# Patient Record
Sex: Male | Born: 1985 | Race: Black or African American | Hispanic: No | Marital: Single | Smoking: Current every day smoker
Health system: Southern US, Community
[De-identification: ages and names within clinical notes are randomized; demographics above are authoritative.]

## PROBLEM LIST (undated history)

## (undated) DIAGNOSIS — I10 Essential (primary) hypertension: Secondary | ICD-10-CM

---
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
## 2016-07-14 ENCOUNTER — Emergency Department (HOSPITAL_COMMUNITY)
Admission: EM | Admit: 2016-07-14 | Discharge: 2016-07-14 | Disposition: A | Discharge status: Home / Self Care | Payer: Self-pay | Attending: Emergency Medicine | Admitting: Emergency Medicine

## 2016-07-14 ENCOUNTER — Encounter (HOSPITAL_COMMUNITY): Payer: Self-pay | Admitting: Emergency Medicine

## 2016-07-14 ENCOUNTER — Emergency Department (HOSPITAL_COMMUNITY): Payer: Self-pay

## 2016-07-14 DIAGNOSIS — S93402A Sprain of unspecified ligament of left ankle, initial encounter: Secondary | ICD-10-CM | POA: Insufficient documentation

## 2016-07-14 DIAGNOSIS — S82832A Other fracture of upper and lower end of left fibula, initial encounter for closed fracture: Secondary | ICD-10-CM | POA: Insufficient documentation

## 2016-07-14 DIAGNOSIS — Y999 Unspecified external cause status: Secondary | ICD-10-CM | POA: Insufficient documentation

## 2016-07-14 DIAGNOSIS — Y929 Unspecified place or not applicable: Secondary | ICD-10-CM | POA: Insufficient documentation

## 2016-07-14 DIAGNOSIS — W1839XA Other fall on same level, initial encounter: Secondary | ICD-10-CM | POA: Insufficient documentation

## 2016-07-14 DIAGNOSIS — Y939 Activity, unspecified: Secondary | ICD-10-CM | POA: Insufficient documentation

## 2016-07-14 MED ORDER — OXYCODONE-ACETAMINOPHEN 5-325 MG PO TABS: 1.0000 | ORAL_TABLET | Freq: Four times a day (QID) | ORAL | 0 refills | Status: AC | PRN

## 2016-07-14 MED ORDER — IBUPROFEN 800 MG PO TABS
800.0000 mg | ORAL_TABLET | Freq: Once | ORAL | Status: AC
  Administered 2016-07-14: 800 mg via ORAL
  Filled 2016-07-14: qty 1

## 2016-07-14 MED ORDER — OXYCODONE-ACETAMINOPHEN 5-325 MG PO TABS
1.0000 | ORAL_TABLET | Freq: Once | ORAL | Status: AC
  Administered 2016-07-14: 1 via ORAL
  Filled 2016-07-14: qty 1

## 2016-07-14 NOTE — Discharge Instructions (Signed)
Please take 1-2 tablets of Percocet as needed for pain. Please schedule an appointment with orthopedics tomorrow for follow-up. Keep lower left leg immobilized, apply ice, elevated, use knee immobilizer and crutches given to him in ED.   SEEK IMMEDIATE MEDICAL CARE IF:  Your cast gets damaged or breaks. You have continued severe pain or more swelling than you did before the cast was put on, or the pain is not controlled with medications. Your skin or nails below the injury turn blue or grey, or feel cold or numb. There is a bad smell or pus coming from under the cast. You develop severe pain in ankle or foot.  Get help right away if: Your toes or foot becomes numb or blue. You have severe pain that gets worse.

## 2016-07-14 NOTE — ED Notes (Signed)
Patient given ice for injury.

## 2016-07-14 NOTE — ED Provider Notes (Signed)
[facility]-EMERGENCY DEPT Provider Note   CSN: [id number] Arrival date & time: 07/14/16  1108     History   Chief Complaint Chief Complaint  Patient presents with  . Ankle Pain    HPI Cory Obrien is a 30 y.o. male with no significant past medical history presents with left ankle pain after mechanical fall this morning at 3 AM. He states that he fell on black ice yesterday while intoxicated and did not realize extent of his injury until he woke up this morning. He states it's a throbbing, sharp, 10/10 pain. He reports left ankle swelling. Patient denies LOC, head injury, chest pain, shortness of breath, fevers, chills, nausea, vomiting, changes in bowel habits, changes in urinary symptoms.  HPI  History reviewed. No pertinent past medical history.  There are no active problems to display for this patient.   History reviewed. No pertinent surgical history.     Home Medications    Prior to Admission medications   Medication Sig Start Date End Date Taking? Authorizing Provider  oxyCODONE-acetaminophen (PERCOCET/ROXICET) 5-325 MG tablet Take 1-2 tablets by mouth every 6 (six) hours as needed for severe pain. 07/14/16   Christon Gallaway Orson AloeManuel Nikole Swartzentruber, [place]    Family History History reviewed. No pertinent family history.  Social History Social History  Substance Use Topics  . Smoking status: Never Smoker  . Smokeless tobacco: Not on file  . Alcohol use Yes     Allergies   Patient has no known allergies.   Review of Systems Review of Systems  Constitutional: Negative for chills and fever.  Respiratory: Negative for cough, chest tightness and shortness of breath.   Cardiovascular: Negative for chest pain.  Gastrointestinal: Negative for abdominal pain, constipation, diarrhea, nausea and vomiting.  Musculoskeletal: Positive for arthralgias (Left ankle) and joint swelling (Left ankle).  Skin: Negative for wound.  Neurological: Negative for headaches.     Physical  Exam Updated Vital Signs BP 148/96 (BP Location: Right Arm)   Pulse 98   Temp 98.8 F (37.1 C) (Oral)   Resp 16   SpO2 100%   Physical Exam  Constitutional: He is oriented to person, place, and time. He appears well-developed and well-nourished.  HENT:  Head: Normocephalic and atraumatic.  Eyes: EOM are normal. Pupils are equal, round, and reactive to light.  Cardiovascular: Normal rate and normal heart sounds.   Pulmonary/Chest: Effort normal. No respiratory distress.  Musculoskeletal: He exhibits edema (Left ankle) and tenderness (Left ankle TTP, limited ROM, no erythema. Left knee TTP, no erythema.). He exhibits no deformity.  Neurological: He is alert and oriented to person, place, and time. No sensory deficit.  Skin: Skin is warm.  Psychiatric: He has a normal mood and affect.  Nursing note and vitals reviewed.    ED Treatments / Results  Labs (all labs ordered are listed, but only abnormal results are displayed) Labs Reviewed - No data to display  EKG  EKG Interpretation None       Radiology Dg Ankle Complete Left  Result Date: 07/14/2016 CLINICAL DATA:  Or EXAM: LEFT ANKLE COMPLETE - 3+ VIEW COMPARISON:  None. FINDINGS: Medial soft-tissue swelling with no underlying fracture or dislocation identified. IMPRESSION: Medial soft tissue swelling.  No fracture identified. Electronically Signed   By: Gerome Samavid  Williams III M.D   On: 07/14/2016 11:58   Dg Knee Complete 4 Views Left  Result Date: 07/14/2016 CLINICAL DATA:  Left knee pain. EXAM: LEFT KNEE - COMPLETE 4+ VIEW COMPARISON:  None. FINDINGS: No evidence  of fracture, dislocation, or joint effusion of the left knee. There is a spiral fracture of the proximal fibula, without significant angulation, and mild lateral displacement of the distal fracture fragment. No evidence of arthropathy or other focal bone abnormality. Soft tissue swelling is noted. IMPRESSION: Spiral fracture of the proximal fibula. No evidence of  fracture or dislocation of the knee. Electronically Signed   By: Ted Mcalpineobrinka  Dimitrova M.D.   On: 07/14/2016 14:22    Procedures Procedures (including critical care time)  Medications Ordered in ED Medications  ibuprofen (ADVIL,MOTRIN) tablet 800 mg (800 mg Oral Given 07/14/16 1353)  oxyCODONE-acetaminophen (PERCOCET/ROXICET) 5-325 MG per tablet 1 tablet (1 tablet Oral Given 07/14/16 1521)     Initial Impression / Assessment and Plan / ED Course  I have reviewed the triage vital signs and the nursing notes.  Pertinent labs & imaging results that were available during my care of the patient were reviewed by me and considered in my medical decision making (see chart for details).  Clinical Course   Patient TTP to left ankle and at left knee. Ankle Xray negative for fracture or dislocation, suspect sprain. Left Knee xray shows evidence of spiral fracture at the proximal fibula. Knee immobilizer and Ace bandage applied. Crutches given. WBAT.  Discussed to schedule an appointment tomorrow for follow-up with orthopedic. Return precautions given for any new or worsening symptoms.    Final Clinical Impressions(s) / ED Diagnoses   Final diagnoses:  Closed fracture of proximal end of left fibula, unspecified fracture morphology, initial encounter  Sprain of left ankle, unspecified ligament, initial encounter    New Prescriptions Discharge Medication List as of 07/14/2016  3:25 PM    START taking these medications   Details  oxyCODONE-acetaminophen (PERCOCET/ROXICET) 5-325 MG tablet Take 1-2 tablets by mouth every 6 (six) hours as needed for severe pain., Starting Sun 07/14/2016, Print         [address]Hjalmer Iovino Manuel [place], [place] 07/14/16 2030    Lyndal Pulleyaniel Knott, MD 07/15/16 [phone]

## 2016-07-14 NOTE — ED Triage Notes (Signed)
Pt reports he was intoxicated last night and fell on ice. Only injury L ankle pain. No LOC or head injury. Has tried to bear weight at home

## 2017-06-05 ENCOUNTER — Encounter (HOSPITAL_COMMUNITY): Payer: Self-pay | Admitting: Emergency Medicine

## 2017-06-05 ENCOUNTER — Emergency Department (HOSPITAL_COMMUNITY)
Admission: EM | Admit: 2017-06-05 | Discharge: 2017-06-05 | Disposition: A | Discharge status: Home / Self Care | Payer: Self-pay | Attending: Emergency Medicine | Admitting: Emergency Medicine

## 2017-06-05 DIAGNOSIS — F17210 Nicotine dependence, cigarettes, uncomplicated: Secondary | ICD-10-CM | POA: Insufficient documentation

## 2017-06-05 DIAGNOSIS — Z79899 Other long term (current) drug therapy: Secondary | ICD-10-CM | POA: Insufficient documentation

## 2017-06-05 DIAGNOSIS — Z202 Contact with and (suspected) exposure to infections with a predominantly sexual mode of transmission: Secondary | ICD-10-CM | POA: Insufficient documentation

## 2017-06-05 DIAGNOSIS — I10 Essential (primary) hypertension: Secondary | ICD-10-CM | POA: Insufficient documentation

## 2017-06-05 HISTORY — DX: Essential (primary) hypertension: I10

## 2017-06-05 NOTE — ED Triage Notes (Signed)
Pt reports exposure to STD, partner Dx with STD yesterday, no symptoms at this time, requesting STD check

## 2017-06-05 NOTE — ED Notes (Signed)
Patient refuses HIV/RPR blood tests today.

## 2017-06-05 NOTE — ED Provider Notes (Signed)
[facility]-EMERGENCY DEPT Provider Note   CSN: [id number] Arrival date & time: 06/05/17  0825     History   Chief Complaint Chief Complaint  Patient presents with  . Exposure to STD    HPI Cory Obrien is a 31 y.o. male.  HPI   Patient is a 32 year old male with a history of hypertension presenting for concern that he may have an STI.  Patient reports that his partner currently tested positive for gonorrhea and was treated.  Patient is unsure if he has any discharge at this time.  Patient denies any penile pain, dysuria, rashes or lesions of the groin, testicular pain, rectal pain, abdominal pain.  Patient has not had any fever or chills.  Patient does not have a history of STI.  Past Medical History:  Diagnosis Date  . Hypertension     There are no active problems to display for this patient.   History reviewed. No pertinent surgical history.     Home Medications    Prior to Admission medications   Medication Sig Start Date End Date Taking? Authorizing Provider  oxyCODONE-acetaminophen (PERCOCET/ROXICET) 5-325 MG tablet Take 1-2 tablets by mouth every 6 (six) hours as needed for severe pain. 07/14/16   Alvina Chou, PA    Family History History reviewed. No pertinent family history.  Social History Social History  Substance Use Topics  . Smoking status: Current Every Day Smoker  . Smokeless tobacco: Not on file  . Alcohol use Yes     Allergies   Patient has no known allergies.   Review of Systems Review of Systems  Constitutional: Negative for chills and fever.  Gastrointestinal: Negative for abdominal pain and rectal pain.  Genitourinary: Negative for discharge, dysuria and flank pain.  Skin: Negative for rash.     Physical Exam Updated Vital Signs BP (!) 163/105 (BP Location: Right Arm)   Pulse 60   Temp 98.4 F (36.9 C) (Oral)   Resp 18   Ht 6\' 4"  (1.93 m)   Wt 99.8 kg (220 lb)   SpO2 100%   BMI  26.78 kg/m   Physical Exam  Constitutional: He appears well-developed and well-nourished. No distress.  Sitting comfortably in bed.  HENT:  Head: Normocephalic and atraumatic.  Eyes: Conjunctivae are normal. Right eye exhibits no discharge. Left eye exhibits no discharge.  EOMs normal to gross examination.  Neck: Normal range of motion.  Cardiovascular: Normal rate and regular rhythm.   Intact, 2+ radial pulse.  Pulmonary/Chest:  Normal respiratory effort. Patient converses comfortably. No audible wheeze or stridor.  Abdominal: He exhibits no distension.  Genitourinary:  Genitourinary Comments: Exam performed with nurse chaperone present.  Circumcised male. No rashes or lesions of the groin.  No inguinal lymphadenopathy palpated.  No pain to palpation of testes or epididymis.  No penile pain.  No active penile discharge.  Musculoskeletal: Normal range of motion.  Neurological: He is alert.  Cranial nerves intact to gross observation. Patient moves extremities without difficulty.  Skin: Skin is warm and dry. He is not diaphoretic.  Psychiatric: He has a normal mood and affect. His behavior is normal. Judgment and thought content normal.  Nursing note and vitals reviewed.    ED Treatments / Results  Labs (all labs ordered are listed, but only abnormal results are displayed) Labs Reviewed  RPR  HIV ANTIBODY (ROUTINE TESTING)  GC/CHLAMYDIA PROBE AMP ([place]) NOT AT [facility]    EKG  EKG Interpretation None  Radiology No results found.  Procedures Procedures (including critical care time)  Medications Ordered in ED Medications - No data to display   Initial Impression / Assessment and Plan / ED Course  I have reviewed the triage vital signs and the nursing notes.  Pertinent labs & imaging results that were available during my care of the patient were reviewed by me and considered in my medical decision making (see chart for details).     Final Clinical  Impressions(s) / ED Diagnoses   Final diagnoses:  STD exposure   Patient is a 31 y.o. male who presents to ED for STI exposure and concern that he has symptoms. He is afebrile without abdominal tenderness, abdominal pain or painful bowel movements to indicate prostatitis.  No tenderness to palpation of the testes or epididymis to suggest orchitis or epididymitis.  STD cultures obtained gonorrhea and chlamydia.  Patient refused HIV and syphilis testing today.  Patient to be discharged with [facility]. Discussed importance of using protection when sexually active. Patient understands that they have GC/Chlamydia cultures pending and that they will need to inform all sexual partners if results return positive.  No active discharge, dysuria, testicular pain, abdominal pain, rectal pain.  I discussed with patient is unlikely he has an STI at this time, and we will defer treatment until results are back, and patient can follow-up on line.  Patient instructed that he will get a call if they are positive with instructions for treatment. Return precautions for any fever, chills, rectal pain, testicular pain, or abdominal pain given.  Precautions given and all questions answered.    New Prescriptions Discharge Medication List as of 06/05/2017 10:07 AM       Elisha PonderMurray, Alyssa B, PA-C 06/05/17 1124    Azalia Bilisampos, Kevin, MD 06/05/17 1540

## 2017-06-05 NOTE — Discharge Instructions (Signed)
Medications.  If her gonorrhea chlamydia test is positive, you will receive a call and instructions on how to get your antibiotics.  Use a condom with every sexual encounter [facility]. Please return to the ER for worsening symptoms, high fevers or persistent vomiting.  You have been tested for chlamydia and gonorrhea. These results will be available in approximately 3 days. You will be notified if they are positive.   It is very important to practice safe sex and use condoms when sexually active. If your results are positive you need to notify all sexual partners so they can be treated as well. The website https://garcia.net/http://www.dontspreadit.com/ can be used to send anonymous text messages or emails to alert sexual contacts.   SEEK IMMEDIATE MEDICAL CARE IF:  You develop an oral temperature above 102 F (38.9 C), not controlled by medications or lasting more than 2 days.  You develop an increase in pain.  You develop penile or testicular pain. You develop painful intercourse.

## 2017-06-06 LAB — GC/CHLAMYDIA PROBE AMP (~~LOC~~) NOT AT ARMC
Chlamydia: POSITIVE — AB
NEISSERIA GONORRHEA: POSITIVE — AB

## 2021-12-17 ENCOUNTER — Emergency Department (HOSPITAL_COMMUNITY)
Admission: EM | Admit: 2021-12-17 | Discharge: 2021-12-17 | Disposition: A | Discharge status: Home / Self Care | Payer: Self-pay | Attending: Emergency Medicine | Admitting: Emergency Medicine

## 2021-12-17 ENCOUNTER — Emergency Department (HOSPITAL_COMMUNITY): Payer: Self-pay

## 2021-12-17 DIAGNOSIS — T40604A Poisoning by unspecified narcotics, undetermined, initial encounter: Secondary | ICD-10-CM

## 2021-12-17 DIAGNOSIS — F1910 Other psychoactive substance abuse, uncomplicated: Secondary | ICD-10-CM

## 2021-12-17 DIAGNOSIS — R410 Disorientation, unspecified: Secondary | ICD-10-CM | POA: Insufficient documentation

## 2021-12-17 DIAGNOSIS — R61 Generalized hyperhidrosis: Secondary | ICD-10-CM | POA: Insufficient documentation

## 2021-12-17 DIAGNOSIS — T40601A Poisoning by unspecified narcotics, accidental (unintentional), initial encounter: Secondary | ICD-10-CM | POA: Insufficient documentation

## 2021-12-17 LAB — CBC WITH DIFFERENTIAL/PLATELET
Abs Immature Granulocytes: 0.2 10*3/uL — ABNORMAL HIGH (ref 0.00–0.07)
Basophils Absolute: 0.1 10*3/uL (ref 0.0–0.1)
Basophils Relative: 1 %
Eosinophils Absolute: 0.3 10*3/uL (ref 0.0–0.5)
Eosinophils Relative: 2 %
HCT: 45.8 % (ref 39.0–52.0)
Hemoglobin: 16 g/dL (ref 13.0–17.0)
Immature Granulocytes: 1 %
Lymphocytes Relative: 22 %
Lymphs Abs: 3.4 10*3/uL (ref 0.7–4.0)
MCH: 31.9 pg (ref 26.0–34.0)
MCHC: 34.9 g/dL (ref 30.0–36.0)
MCV: 91.4 fL (ref 80.0–100.0)
Monocytes Absolute: 0.9 10*3/uL (ref 0.1–1.0)
Monocytes Relative: 6 %
Neutro Abs: 10.3 10*3/uL — ABNORMAL HIGH (ref 1.7–7.7)
Neutrophils Relative %: 68 %
Platelets: 308 10*3/uL (ref 150–400)
RBC: 5.01 MIL/uL (ref 4.22–5.81)
RDW: 13.2 % (ref 11.5–15.5)
WBC: 15 10*3/uL — ABNORMAL HIGH (ref 4.0–10.5)
nRBC: 0 % (ref 0.0–0.2)

## 2021-12-17 LAB — COMPREHENSIVE METABOLIC PANEL
ALT: 30 U/L (ref 0–44)
AST: 22 U/L (ref 15–41)
Albumin: 4.8 g/dL (ref 3.5–5.0)
Alkaline Phosphatase: 93 U/L (ref 38–126)
Anion gap: 9 (ref 5–15)
BUN: 11 mg/dL (ref 6–20)
CO2: 25 mmol/L (ref 22–32)
Calcium: 9.2 mg/dL (ref 8.9–10.3)
Chloride: 107 mmol/L (ref 98–111)
Creatinine, Ser: 1.09 mg/dL (ref 0.61–1.24)
GFR, Estimated: >60 mL/min (ref >60)
Glucose, Bld: 223 mg/dL — ABNORMAL HIGH (ref 70–99)
Potassium: 3.3 mmol/L — ABNORMAL LOW (ref 3.5–5.1)
Sodium: 141 mmol/L (ref 135–145)
Total Bilirubin: 0.4 mg/dL (ref 0.3–1.2)
Total Protein: 8.4 g/dL — ABNORMAL HIGH (ref 6.5–8.1)

## 2021-12-17 LAB — RAPID URINE DRUG SCREEN, HOSP PERFORMED
Amphetamines: NOT DETECTED
Barbiturates: NOT DETECTED
Benzodiazepines: NOT DETECTED
Cocaine: POSITIVE — AB
Opiates: NOT DETECTED
Tetrahydrocannabinol: POSITIVE — AB

## 2021-12-17 LAB — LACTIC ACID, PLASMA: Lactic Acid, Venous: 1.1 mmol/L (ref 0.5–1.9)

## 2021-12-17 LAB — CBG MONITORING, ED
Glucose-Capillary: 210 mg/dL — ABNORMAL HIGH (ref 70–99)
Glucose-Capillary: 180 mg/dL — ABNORMAL HIGH (ref 70–99)

## 2021-12-17 LAB — ETHANOL: Alcohol, Ethyl (B): <10 mg/dL (ref <10)

## 2021-12-17 MED ORDER — NALOXONE HCL 2 MG/2ML IJ SOSY
PREFILLED_SYRINGE | INTRAMUSCULAR | Status: AC
  Filled 2021-12-17: qty 2

## 2021-12-17 MED ORDER — LACTATED RINGERS IV SOLN: INTRAVENOUS | Status: DC

## 2021-12-17 MED ORDER — LACTATED RINGERS IV BOLUS
1000.0000 mL | Freq: Once | INTRAVENOUS | Status: AC
  Administered 2021-12-17: 1000 mL via INTRAVENOUS

## 2021-12-17 MED ORDER — NALOXONE HCL 2 MG/2ML IJ SOSY
1.0000 mg | PREFILLED_SYRINGE | Freq: Once | INTRAMUSCULAR | Status: AC
  Administered 2021-12-17: 1 mg via INTRAVENOUS

## 2021-12-17 NOTE — ED Notes (Signed)
Verbal order from Dr Freida Busman to give 1mg  narcan.  ?

## 2021-12-17 NOTE — ED Triage Notes (Signed)
Ems brings pt in from home. Pt was found by roommate on the floor unresponsive, diaphoretic. Ems gave narcan IN x1, IV x1. Unknown substance use. ?

## 2021-12-17 NOTE — ED Provider Notes (Addendum)
?[facility]-EMERGENCY DEPT ?Provider Note ? ? ?CSN: [id number] ?Arrival date & time: 12/17/21  1456 ? ?  ? ?History ? ?Chief Complaint  ?Patient presents with  ? Drug Overdose  ? ? ?Cory Obrien is a 36 y.o. male. ? ?36 year old male presents via EMS after being found by his roommates with decreased level consciousness.  Patient reportedly uses illegal substances.  He also drinks alcohol.  Was found diaphoretic at home.  Given Narcan intranasal and IV.  Blood sugar was above 100.  Good response to this and patient transported here ? ? ?  ? ?Home Medications ?Prior to Admission medications   ?Medication Sig Start Date End Date Taking? Authorizing Provider  ?oxyCODONE-acetaminophen (PERCOCET/ROXICET) 5-325 MG tablet Take 1-2 tablets by mouth every 6 (six) hours as needed for severe pain. 07/14/16   Alvina Chou, PA  ?   ? ?Allergies    ?Patient has no known allergies.   ? ?Review of Systems   ?Review of Systems  ?Unable to perform ROS: Mental status change  ? ?Physical Exam ?Updated Vital Signs ?BP (!) 185/133   Pulse 69   Temp (!) 95.1 ?F (35.1 ?C) (Rectal)   Resp 11   SpO2 97%  ?Physical Exam ?Vitals and nursing note reviewed.  ?Constitutional:   ?   General: He is not in acute distress. ?   Appearance: Normal appearance. He is well-developed. He is ill-appearing, toxic-appearing and diaphoretic.  ?HENT:  ?   Head: Normocephalic and atraumatic.  ?Eyes:  ?   General: Lids are normal.  ?   Conjunctiva/sclera: Conjunctivae normal.  ?   Pupils: Pupils are equal, round, and reactive to light.  ?Neck:  ?   Thyroid: No thyroid mass.  ?   Trachea: No tracheal deviation.  ?Cardiovascular:  ?   Rate and Rhythm: Normal rate and regular rhythm.  ?   Heart sounds: Normal heart sounds. No murmur heard. ?  No gallop.  ?Pulmonary:  ?   Effort: Pulmonary effort is normal. No respiratory distress.  ?   Breath sounds: Normal breath sounds. No stridor. No decreased breath sounds, wheezing,  rhonchi or rales.  ?Abdominal:  ?   General: There is no distension.  ?   Palpations: Abdomen is soft.  ?   Tenderness: There is no abdominal tenderness. There is no rebound.  ?Musculoskeletal:     ?   General: No tenderness. Normal range of motion.  ?   Cervical back: Normal range of motion and neck supple.  ?Skin: ?   General: Skin is warm.  ?   Findings: No abrasion or rash.  ?   Comments: Diaphoresis noted  ?Neurological:  ?   Mental Status: Mental status is at baseline. He is lethargic and disoriented.  ?   GCS: GCS eye subscore is 4. GCS verbal subscore is 4. GCS motor subscore is 6.  ?   Cranial Nerves: No cranial nerve deficit.  ?   Sensory: No sensory deficit.  ?   Motor: No tremor or abnormal muscle tone.  ?Psychiatric:     ?   Attention and Perception: Attention normal.     ?   Speech: Speech normal.     ?   Behavior: Behavior normal.  ? ? ?ED Results / Procedures / Treatments   ?Labs ?(all labs ordered are listed, but only abnormal results are displayed) ?Labs Reviewed  ?CBG MONITORING, ED - Abnormal; Notable for the following components:  ?  Result Value  ? Glucose-Capillary 210 (*)   ? All other components within normal limits  ?CBC WITH DIFFERENTIAL/PLATELET  ?RAPID URINE DRUG SCREEN, HOSP PERFORMED  ?COMPREHENSIVE METABOLIC PANEL  ?ETHANOL  ? ? ?EKG ?EKG Interpretation ? ?Date/Time:  Monday Dec 17 2021 15:16:11 EDT ?Ventricular Rate:  72 ?PR Interval:  161 ?QRS Duration: 90 ?QT Interval:  434 ?QTC Calculation: 475 ?R Axis:   48 ?Text Interpretation: Sinus rhythm Borderline prolonged QT interval Confirmed by Lorre Nick ([id number]) on 12/17/2021 3:42:40 PM ? ?Radiology ?No results found. ? ?Procedures ?Procedures  ? ? ?Medications Ordered in ED ?Medications  ?lactated ringers bolus 1,000 mL (has no administration in time range)  ?lactated ringers infusion (has no administration in time range)  ? ? ?ED Course/ Medical Decision Making/ A&P ?  ?                        ?Medical Decision Making ?Amount  and/or Complexity of Data Reviewed ?Labs: ordered. ?Radiology: ordered. ? ?Risk ?Prescription drug management. ? ? ?Patient came here after having unknown overdose.  Patient would protect his airway.  Was given IV fluids here.  UDS positive for cocaine.  Considered possible intracranial abnormality including hemorrhage.  Did have a head CT which per my review interpretation was negative for any acute process.  CT cervical spine per my review interpretation showed no acute findings.  Electrolytes significant for mild hyperglycemia at 223.  Alcohol level negative.  Patient more somnolent and given Narcan 1 mg and improved his mental status greatly.  Suspect that patient did take an unknown opiate as he had a significant response to this.  Patient monitored here for several hours and multiple rechecks and is now awake and alert.  He denies any suicidal or homicidal ideations.  No acute psychosis noted.  States that he was only trying to become intoxicated.  Will be discharged.  Patient does admit to ingesting other substances at this time. ? ? ?CRITICAL CARE ?Performed by: Toy Baker ?Total critical care time: 50 minutes ?Critical care time was exclusive of separately billable procedures and treating other patients. ?Critical care was necessary to treat or prevent imminent or life-threatening deterioration. ?Critical care was time spent personally by me on the following activities: development of treatment plan with patient and/or surrogate as well as nursing, discussions with consultants, evaluation of patient's response to treatment, examination of patient, obtaining history from patient or surrogate, ordering and performing treatments and interventions, ordering and review of laboratory studies, ordering and review of radiographic studies, pulse oximetry and re-evaluation of patient's condition. ? ? ? ? ? ? ? ?Final Clinical Impression(s) / ED Diagnoses ?Final diagnoses:  ?None  ? ? ?Rx / DC Orders ?ED  Discharge Orders   ? ? None  ? ?  ? ? ?  ?Lorre Nick, MD ?12/17/21 2148 ? ?  ?Lorre Nick, MD ?12/17/21 2149 ? ?  ?Lorre Nick, MD ?12/17/21 2150 ? ?

## 2021-12-22 LAB — CULTURE, BLOOD (ROUTINE X 2)
Culture: NO GROWTH
Culture: NO GROWTH
Special Requests: ADEQUATE

## 2023-03-01 IMAGING — CT CT HEAD W/O CM
3 series · 14 of 47 positions shown, 16 images · non-contrast
Comparison: None Available.

CLINICAL DATA: Altered mental status



[Series 2: head wo · axial · 0.50mm/px · z∈[-92,+43]mm · 8 of 33 slices shown, 10 images]
[im 3/33  brain]
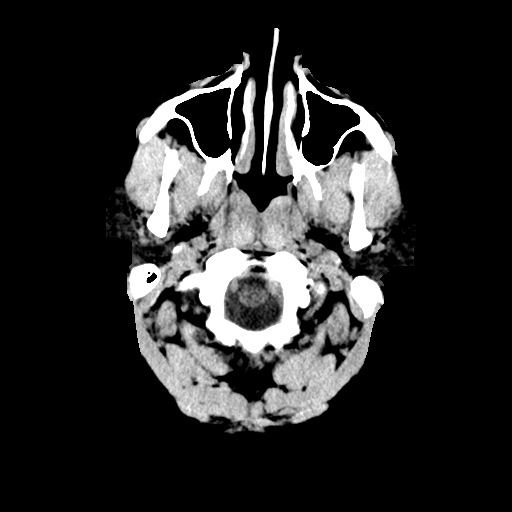
[im 3/33  bone]
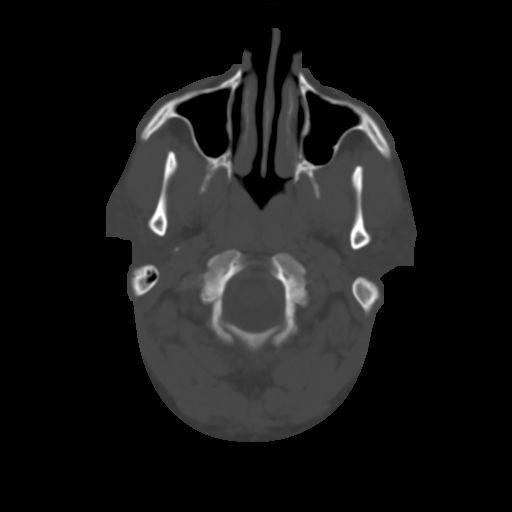
[im 7/33  brain]
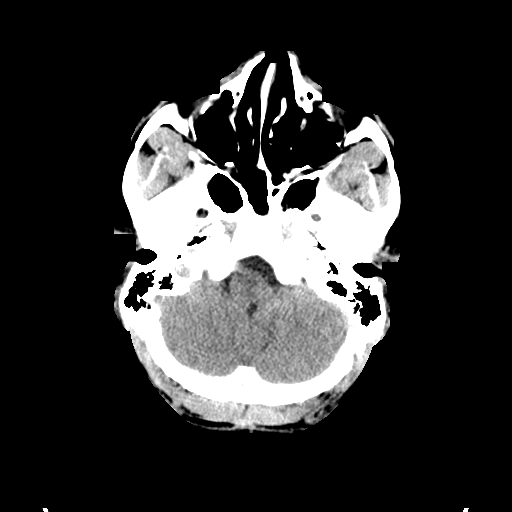
[im 10/33  brain]
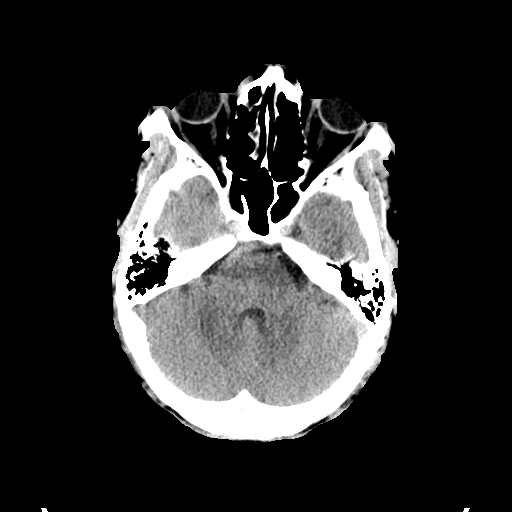
[im 15/33  brain]
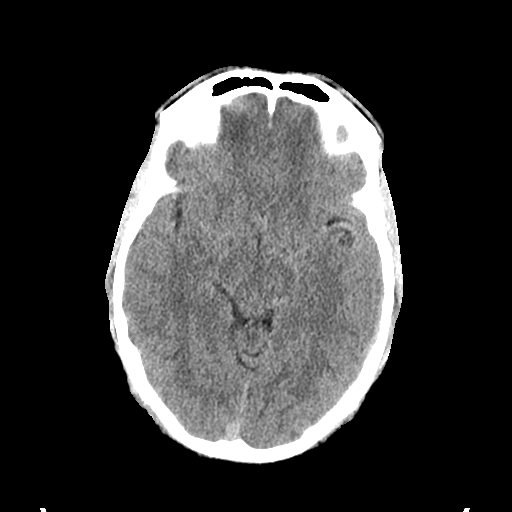
[im 18/33  brain]
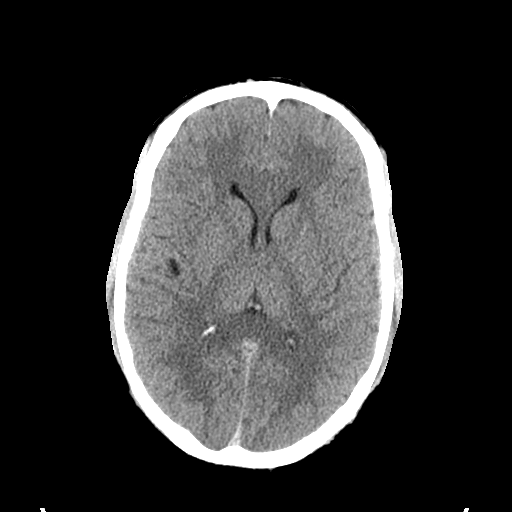
[im 18/33  bone]
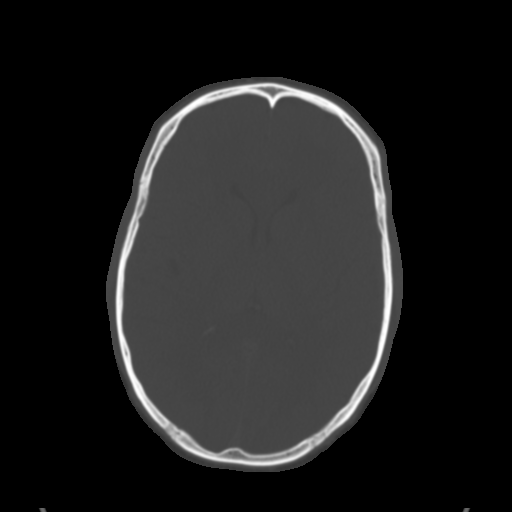
[im 23/33  brain]
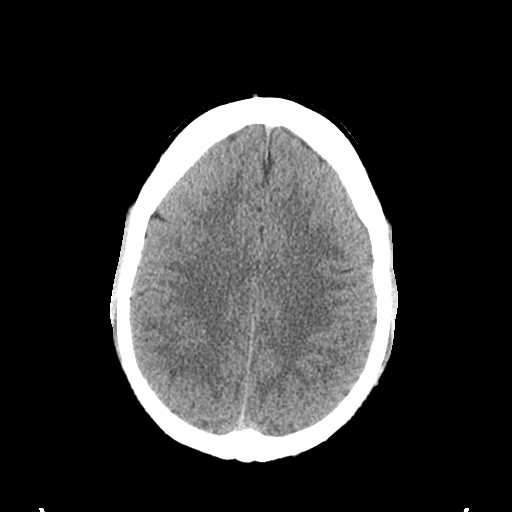
[im 26/33  brain]
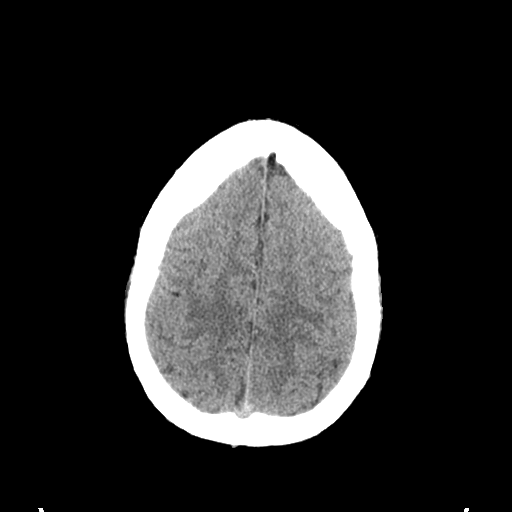
[im 30/33  brain]
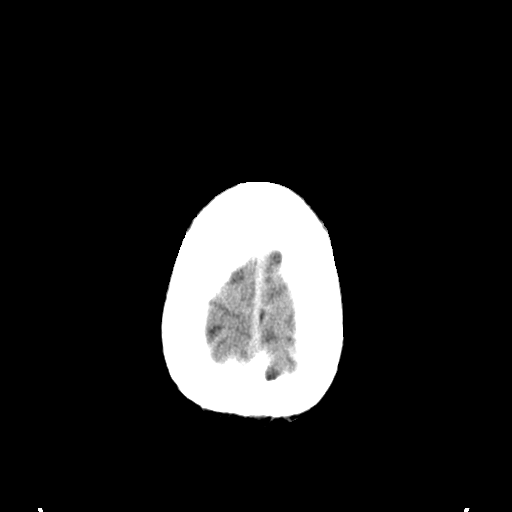

[Series 4: coronal soft tissue · coronal · 0.35mm/px · 3 of 69 slices shown]
[im 23/69  brain]
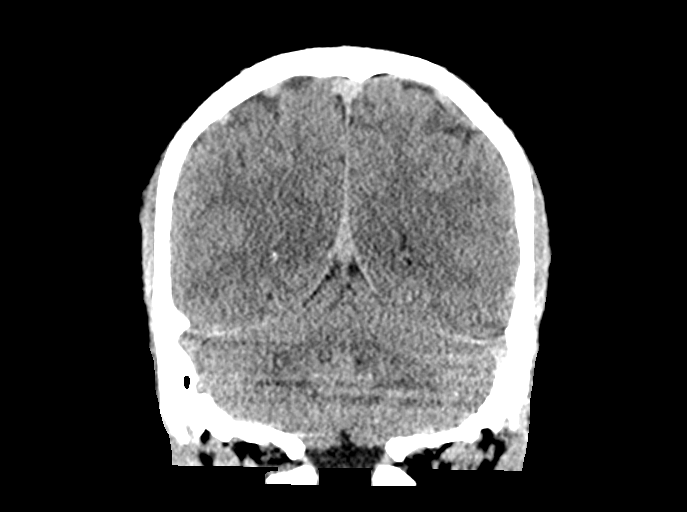
[im 31/69  brain]
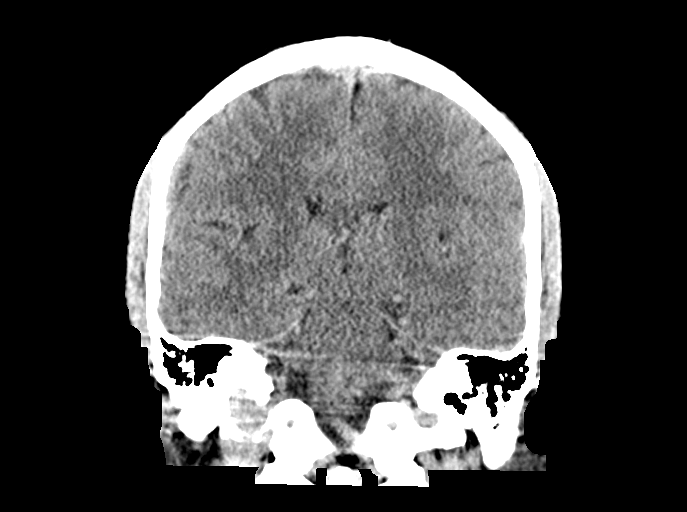
[im 38/69  brain]
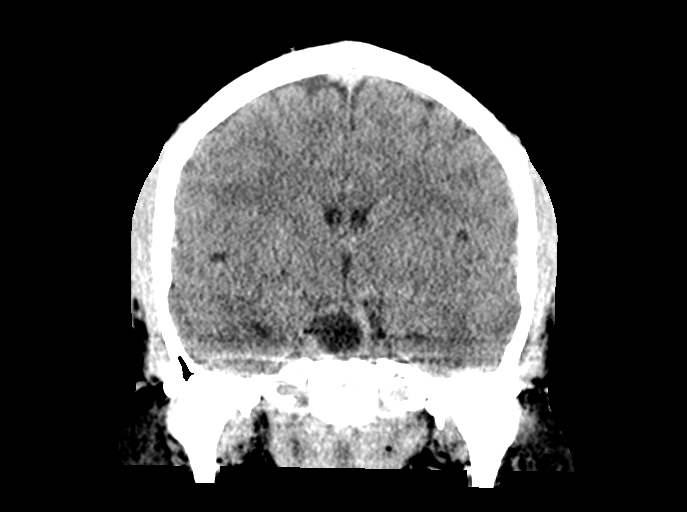

[Series 5: sagittal soft tissue · sagittal · 0.34mm/px · 3 of 57 slices shown]
[im 19/57  brain]
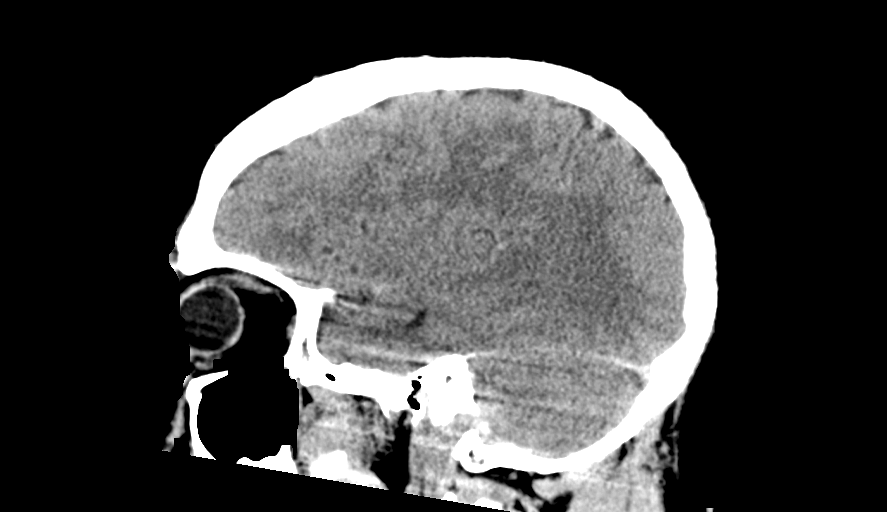
[im 29/57  brain]
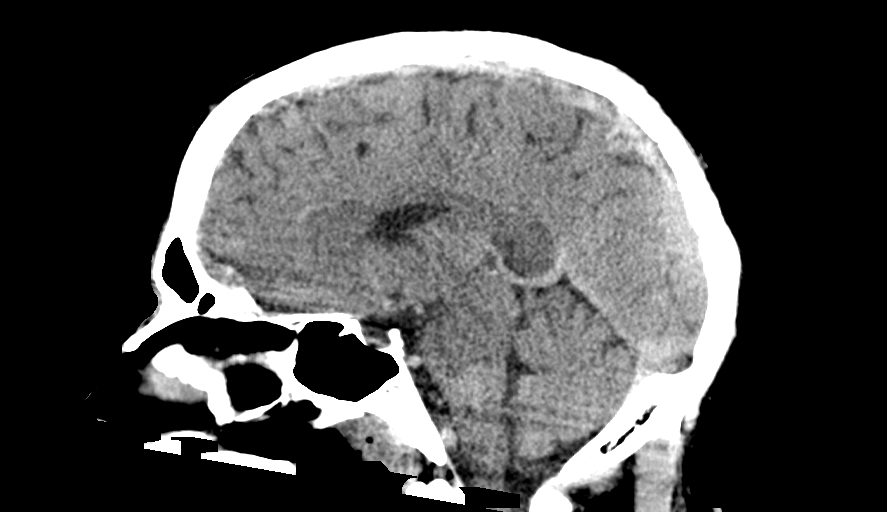
[im 38/57  brain]
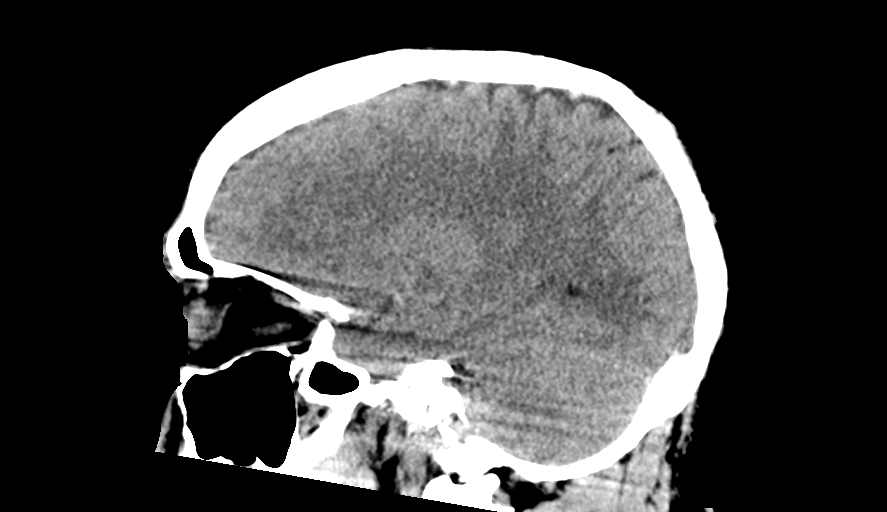

[14 of 47 positions shown; findings below may reference images not displayed]

FINDINGS: Brain: No acute intracranial hemorrhage, mass effect, or herniation.
No extra-axial fluid collections. No evidence of acute territorial
infarct. No hydrocephalus.

Vascular: No hyperdense vessel or unexpected calcification.

Skull: Normal. Negative for fracture or focal lesion.

Sinuses/Orbits: No acute finding.

Other: None.
IMPRESSION: No acute intracranial process identified.
